# Patient Record
Sex: Female | Born: 1970 | Race: Black or African American | Hispanic: No | Marital: Married
Health system: Southern US, Community
[De-identification: ages and names within clinical notes are randomized; demographics above are authoritative.]

---

## 1998-12-16 ENCOUNTER — Other Ambulatory Visit: Admission: RE | Admit: 1998-12-16 | Discharge: 1998-12-16 | Payer: Self-pay | Admitting: Family Medicine

## 2004-04-12 ENCOUNTER — Ambulatory Visit: Payer: Self-pay | Admitting: Hematology & Oncology

## 2005-01-18 ENCOUNTER — Other Ambulatory Visit: Admission: RE | Admit: 2005-01-18 | Discharge: 2005-01-18 | Payer: Self-pay | Admitting: Family Medicine

## 2005-10-02 ENCOUNTER — Ambulatory Visit (HOSPITAL_COMMUNITY): Admission: RE | Admit: 2005-10-02 | Discharge: 2005-10-02 | Payer: Self-pay | Admitting: Obstetrics and Gynecology

## 2007-05-21 IMAGING — RF DG HYSTEROGRAM
5 series · 5 of 5 positions shown · IV contrast (omnipaque)
Comparison: none

CLINICAL DATA: History of spontaneous abortions.  
 HYSTEROSALPINGOGRAM:
 Following cleansing of the cervix and vagina with Betadine solution, a hysterosalpingogram was performed using a 5-French hysterosalpingogram catheter and Omnipaque 300 contrast.  The patient tolerated the examination without difficulty.

[Series 1: run · 1 of 1 slices shown (1 of 5)]
[im 1/1]
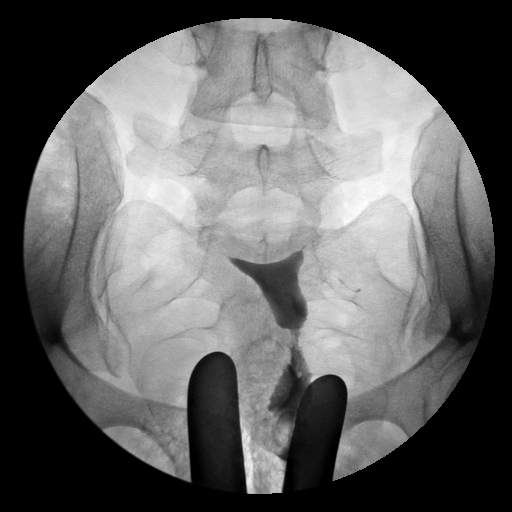

[Series 2: run · 1 of 1 slices shown (2 of 5)]
[im 1/1]
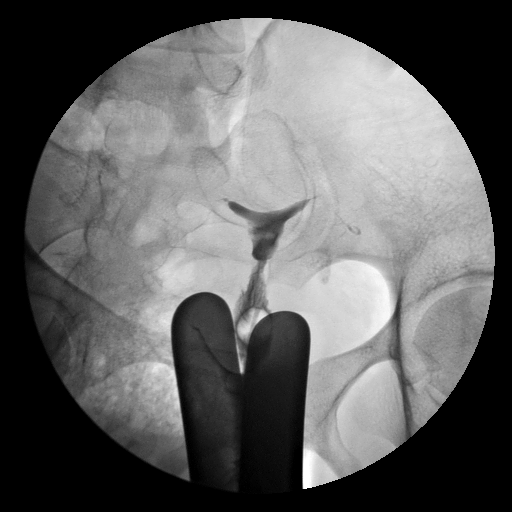

[Series 3: run · 1 of 1 slices shown (3 of 5)]
[im 1/1]
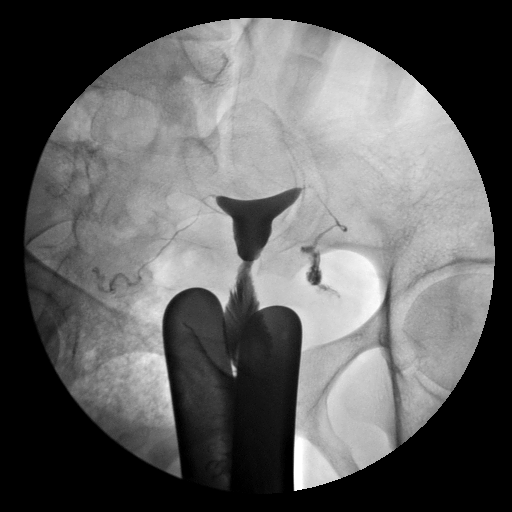

[Series 4: run · 1 of 1 slices shown (4 of 5)]
[im 1/1]
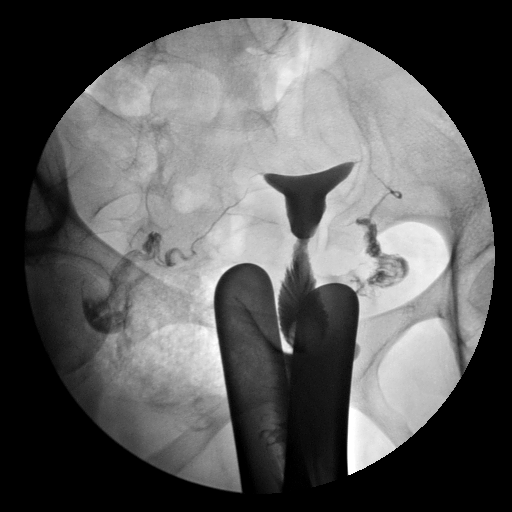

[Series 5: run · 1 of 1 slices shown (5 of 5)]
[im 1/1]
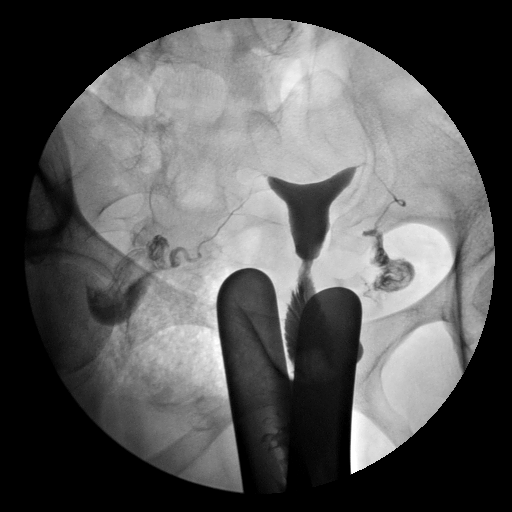

[5 of 5 positions shown; findings below may reference images not displayed]

FINDINGS: Uterine cavity size and contour is normal.  Specifically, there is no evidence for mass effect to suggest submucosal fibroids and there is no evidence for uterine synechia.  
 Both fallopian tubes opacify normally with free spill of contrast into the endometrial cavity demonstrated bilaterally.
IMPRESSION: Normal exam.

## 2011-11-27 ENCOUNTER — Other Ambulatory Visit (HOSPITAL_COMMUNITY)
Admission: RE | Admit: 2011-11-27 | Discharge: 2011-11-27 | Disposition: A | Discharge status: Home / Self Care | Payer: Commercial Indemnity | Source: Ambulatory Visit | Attending: Family Medicine | Admitting: Family Medicine

## 2011-11-27 ENCOUNTER — Other Ambulatory Visit: Payer: Self-pay | Admitting: Family Medicine

## 2011-11-27 DIAGNOSIS — Z124 Encounter for screening for malignant neoplasm of cervix: Secondary | ICD-10-CM | POA: Insufficient documentation

## 2011-11-27 DIAGNOSIS — Z1151 Encounter for screening for human papillomavirus (HPV): Secondary | ICD-10-CM | POA: Insufficient documentation

## 2013-01-02 ENCOUNTER — Ambulatory Visit (HOSPITAL_COMMUNITY)
Admission: RE | Admit: 2013-01-02 | Discharge: 2013-01-02 | Disposition: A | Discharge status: Home / Self Care | Payer: Commercial Indemnity | Source: Ambulatory Visit

## 2013-01-02 ENCOUNTER — Other Ambulatory Visit (HOSPITAL_COMMUNITY): Payer: Self-pay

## 2013-01-02 DIAGNOSIS — Z1231 Encounter for screening mammogram for malignant neoplasm of breast: Secondary | ICD-10-CM

## 2015-03-30 ENCOUNTER — Other Ambulatory Visit (HOSPITAL_COMMUNITY)
Admission: RE | Admit: 2015-03-30 | Discharge: 2015-03-30 | Disposition: A | Discharge status: Home / Self Care | Payer: Managed Care, Other (non HMO) | Source: Ambulatory Visit | Attending: Family Medicine | Admitting: Family Medicine

## 2015-03-30 ENCOUNTER — Other Ambulatory Visit: Payer: Self-pay | Admitting: Family Medicine

## 2015-03-30 DIAGNOSIS — Z01419 Encounter for gynecological examination (general) (routine) without abnormal findings: Secondary | ICD-10-CM | POA: Insufficient documentation

## 2015-04-01 LAB — CYTOLOGY - PAP

## 2015-06-21 ENCOUNTER — Ambulatory Visit
Admission: RE | Admit: 2015-06-21 | Discharge: 2015-06-21 | Disposition: A | Discharge status: Home / Self Care | Payer: Managed Care, Other (non HMO) | Source: Ambulatory Visit | Attending: Family Medicine | Admitting: Family Medicine

## 2015-06-21 ENCOUNTER — Other Ambulatory Visit: Payer: Self-pay | Admitting: Family Medicine

## 2015-06-21 DIAGNOSIS — R079 Chest pain, unspecified: Secondary | ICD-10-CM

## 2015-07-27 ENCOUNTER — Ambulatory Visit
Admission: RE | Admit: 2015-07-27 | Discharge: 2015-07-27 | Disposition: A | Discharge status: Home / Self Care | Payer: Managed Care, Other (non HMO) | Source: Ambulatory Visit | Attending: Family Medicine | Admitting: Family Medicine

## 2015-07-27 ENCOUNTER — Other Ambulatory Visit: Payer: Self-pay | Admitting: Family Medicine

## 2015-07-27 DIAGNOSIS — J181 Lobar pneumonia, unspecified organism: Secondary | ICD-10-CM

## 2015-07-29 ENCOUNTER — Other Ambulatory Visit: Payer: Self-pay | Admitting: Family Medicine

## 2015-07-29 DIAGNOSIS — R9389 Abnormal findings on diagnostic imaging of other specified body structures: Secondary | ICD-10-CM

## 2015-08-04 ENCOUNTER — Ambulatory Visit
Admission: RE | Admit: 2015-08-04 | Discharge: 2015-08-04 | Disposition: A | Discharge status: Home / Self Care | Payer: Managed Care, Other (non HMO) | Source: Ambulatory Visit | Attending: Family Medicine | Admitting: Family Medicine

## 2015-08-04 DIAGNOSIS — R9389 Abnormal findings on diagnostic imaging of other specified body structures: Secondary | ICD-10-CM

## 2015-08-04 MED ORDER — IOPAMIDOL (ISOVUE-300) INJECTION 61%
75.0000 mL | Freq: Once | INTRAVENOUS | Status: AC | PRN
  Administered 2015-08-04: 75 mL via INTRAVENOUS

## 2015-12-01 ENCOUNTER — Ambulatory Visit: Payer: Managed Care, Other (non HMO) | Attending: Chiropractic Medicine | Admitting: Physical Therapy

## 2015-12-01 DIAGNOSIS — M542 Cervicalgia: Secondary | ICD-10-CM | POA: Insufficient documentation

## 2015-12-01 DIAGNOSIS — R252 Cramp and spasm: Secondary | ICD-10-CM | POA: Insufficient documentation

## 2015-12-12 ENCOUNTER — Ambulatory Visit: Payer: Managed Care, Other (non HMO) | Admitting: Physical Therapy

## 2015-12-12 ENCOUNTER — Encounter: Payer: Self-pay | Admitting: Physical Therapy

## 2015-12-12 DIAGNOSIS — R252 Cramp and spasm: Secondary | ICD-10-CM | POA: Diagnosis present

## 2015-12-12 DIAGNOSIS — M542 Cervicalgia: Secondary | ICD-10-CM | POA: Diagnosis not present
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
## 2015-12-20 ENCOUNTER — Ambulatory Visit: Payer: Managed Care, Other (non HMO) | Admitting: Physical Therapy

## 2015-12-22 ENCOUNTER — Encounter: Payer: Self-pay | Admitting: Physical Therapy

## 2015-12-22 ENCOUNTER — Ambulatory Visit: Payer: Managed Care, Other (non HMO) | Admitting: Physical Therapy

## 2015-12-22 DIAGNOSIS — M542 Cervicalgia: Secondary | ICD-10-CM

## 2015-12-22 DIAGNOSIS — R252 Cramp and spasm: Secondary | ICD-10-CM

## 2015-12-22 NOTE — Therapy (Signed)
[facility] [address] [place], [place], [address] Phone: [phone]   Fax:  [phone]  Physical Therapy Treatment  Patient Details  Name: Tonya Jones MRN: [id number] Date of Birth: 03/03/1971 Referring Provider: Mauri Reading  Encounter Date: 12/22/2015      PT End of Session - 12/22/15 1644    Visit Number 2   Date for PT Re-Evaluation 02/11/16   PT Start Time 1600   PT Stop Time 1644   PT Time Calculation (min) 44 min      History reviewed. No pertinent past medical history.  History reviewed. No pertinent surgical history.  There were no vitals filed for this visit.      Subjective Assessment - 12/22/15 1604    Subjective "Ive been doing great ever since he did that ultrasound"   Currently in Pain? No/denies   Pain Score 0-No pain            [facility] PT Assessment - 12/22/15 0001      AROM   Overall AROM Comments Cervical ROM WFL     Strength   Overall Strength Comments shoulder strength 4/5                      [facility] Adult PT Treatment/Exercise - 12/22/15 0001      Exercises   Exercises Shoulder;Neck     Neck Exercises: Machines for Strengthening   UBE (Upper Arm Bike) L2 x6 min    Cybex Row 20lb 2x10   Other Machines for Strengthening Lats 20lb 2x10     Shoulder Exercises: Standing   Horizontal ABduction Both;10 reps  x2   Theraband Level (Shoulder Horizontal ABduction) Level 1 (Yellow)   External Rotation 10 reps;Theraband;Both  x2   Theraband Level (Shoulder External Rotation) Level 1 (Yellow)   Extension Both;15 reps;Theraband   Theraband Level (Shoulder Extension) Level 2 (Red)   Row Both;15 reps;Theraband   Theraband Level (Shoulder Row) Level 2 (Red)   Other Standing Exercises OHP with yellow ball 2x10     Modalities   Modalities Ultrasound     Ultrasound   Ultrasound Location L trap    Ultrasound Parameters 1.2w/cm2   Ultrasound Goals Pain                   PT Short Term Goals - 12/22/15 1608      PT SHORT TERM GOAL #1   Title independent with initial HEP   Status Achieved           PT Long Term Goals - 12/22/15 1608      PT LONG TERM GOAL #1   Title decrease pain 50%   Status Achieved     PT LONG TERM GOAL #3   Title report no difficulty backing up the car   Status Partially Met     PT LONG TERM GOAL #4   Title return to gym safely   Status Partially Met               Plan - 12/22/15 1644    Clinical Impression Statement Pt reports that she is doing well overall. Pt has progressed and met some LTG's. Pt reports a pulling sensation with shoulder ER, other wise no issues with other interventions.   Rehab Potential Good   PT Frequency 2x / week   PT Duration 8 weeks   PT Treatment/Interventions ADLs/Self Care Home Management;Cryotherapy;Electrical Stimulation;Moist Heat;Ultrasound;Traction;Patient/family education;Therapeutic exercise;Therapeutic activities;Manual techniques  PT Next Visit Plan progress as tolerated      Patient will benefit from skilled therapeutic intervention in order to improve the following deficits and impairments:  Decreased range of motion, Decreased strength, Increased fascial restricitons, Increased muscle spasms, Impaired flexibility, Postural dysfunction, Improper body mechanics, Pain  Visit Diagnosis: Cervicalgia  Cramp and spasm     Problem List There are no active problems to display for this patient.   Scot Jun, PTA  12/22/2015, 4:46 PM  [address] [place], [place], [address] Phone: [phone]   Fax:  [phone]  Name: Taiwana Willison MRN: [id number] Date of Birth: 09/17/1970

## 2016-01-03 ENCOUNTER — Encounter: Payer: Self-pay | Admitting: Physical Therapy

## 2016-01-03 ENCOUNTER — Ambulatory Visit: Payer: Managed Care, Other (non HMO) | Attending: Chiropractic Medicine | Admitting: Physical Therapy

## 2016-01-03 DIAGNOSIS — R252 Cramp and spasm: Secondary | ICD-10-CM | POA: Diagnosis present

## 2016-01-03 DIAGNOSIS — M542 Cervicalgia: Secondary | ICD-10-CM | POA: Insufficient documentation

## 2016-01-03 NOTE — Therapy (Signed)
[place] [address] [place], [place], [address] Phone: [phone]   Fax:  [phone]  Physical Therapy Treatment  Patient Details  Name: Tonya Jones MRN: [id number] Date of Birth: 1971-01-06 Referring Provider: Berta Minor  Encounter Date: 01/03/2016      PT End of Session - 01/03/16 1629    Visit Number 3   Date for PT Re-Evaluation 02/11/16   PT Start Time 1604   PT Stop Time 1630   PT Time Calculation (min) 26 min   Activity Tolerance Patient tolerated treatment well   Behavior During Therapy [facility] for tasks assessed/performed      History reviewed. No pertinent past medical history.  History reviewed. No pertinent surgical history.  There were no vitals filed for this visit.      Subjective Assessment - 01/03/16 1608    Subjective "Im doing great"   Currently in Pain? No/denies   Pain Score 0-No pain            [facility] PT Assessment - 01/03/16 0001      AROM   Overall AROM Comments Cervical ROM WFL                     [facility] Adult PT Treatment/Exercise - 01/03/16 0001      Neck Exercises: Machines for Strengthening   UBE (Upper Arm Bike) L2 x6 min    Cybex Row 20lb 2x10   Cybex Chest Press 10 lb 2x15    Other Machines for Strengthening Lats 20lb 2x10     Neck Exercises: Standing   Other Standing Exercises Tband rows green 2x15    Other Standing Exercises Shoulder ext red tband 2x15;                    PT Short Term Goals - 12/22/15 1608      PT SHORT TERM GOAL #1   Title independent with initial HEP   Status Achieved           PT Long Term Goals - 01/03/16 1608      PT LONG TERM GOAL #1   Title decrease pain 50%   Status Achieved     PT LONG TERM GOAL #2   Title increase cervical ROM to WNL's   Status Achieved     PT LONG TERM GOAL #3   Title report no difficulty backing up the car   Status Achieved     PT LONG TERM GOAL #4   Title return to  gym safely   Status Achieved               Plan - 01/03/16 1630    Clinical Impression Statement Pt has progressed and completed all goals.   Rehab Potential Good   PT Frequency 2x / week   PT Duration 8 weeks   PT Treatment/Interventions ADLs/Self Care Home Management;Cryotherapy;Electrical Stimulation;Moist Heat;Ultrasound;Traction;Patient/family education;Therapeutic exercise;Therapeutic activities;Manual techniques   PT Next Visit Plan D/C      Patient will benefit from skilled therapeutic intervention in order to improve the following deficits and impairments:  Decreased range of motion, Decreased strength, Increased fascial restricitons, Increased muscle spasms, Impaired flexibility, Postural dysfunction, Improper body mechanics, Pain  Visit Diagnosis: Cervicalgia  Cramp and spasm     Problem List There are no active problems to display for this patient.   PHYSICAL THERAPY DISCHARGE SUMMARY  Visits from Start of Care: 3 Plan: Patient agrees to discharge.  Patient goals were not met. Patient is being discharged due to meeting the stated rehab goals.  ?????       Scot Jun, PTA  01/03/2016, 4:31 PM  [address] [place], [place], [address] Phone: [phone]   Fax:  [phone]  Name: Rashiya Lofland MRN: [id number] Date of Birth: [birth year]

## 2016-01-05 ENCOUNTER — Ambulatory Visit: Payer: Managed Care, Other (non HMO) | Admitting: Physical Therapy

## 2019-05-12 ENCOUNTER — Other Ambulatory Visit: Payer: Self-pay | Admitting: Family Medicine

## 2019-05-12 ENCOUNTER — Other Ambulatory Visit (HOSPITAL_COMMUNITY)
Admission: RE | Admit: 2019-05-12 | Discharge: 2019-05-12 | Disposition: A | Discharge status: Home / Self Care | Payer: BC Managed Care – PPO | Source: Ambulatory Visit | Attending: Family Medicine | Admitting: Family Medicine

## 2019-05-12 DIAGNOSIS — Z124 Encounter for screening for malignant neoplasm of cervix: Secondary | ICD-10-CM | POA: Insufficient documentation

## 2019-05-15 LAB — CYTOLOGY - PAP
Comment: NEGATIVE
Diagnosis: NEGATIVE
High risk HPV: NEGATIVE
